# Patient Record
Sex: Male | Born: 1950 | Race: White | Hispanic: No
Health system: Southern US, Community
[De-identification: ages and names within clinical notes are randomized; demographics above are authoritative.]

---

## 2012-02-17 ENCOUNTER — Inpatient Hospital Stay: Payer: Self-pay | Admitting: Internal Medicine

## 2012-02-17 LAB — BASIC METABOLIC PANEL
Chloride: 103 mmol/L (ref 98–107)
Co2: 29 mmol/L (ref 21–32)
Glucose: 93 mg/dL (ref 65–99)
Osmolality: 277 (ref 275–301)
Potassium: 4.4 mmol/L (ref 3.5–5.1)

## 2012-02-17 LAB — CBC
HGB: 15.1 g/dL (ref 13.0–18.0)
MCV: 90 fL (ref 80–100)
RBC: 4.94 10*6/uL (ref 4.40–5.90)
RDW: 14.5 % (ref 11.5–14.5)
WBC: 10.1 10*3/uL (ref 3.8–10.6)

## 2012-02-17 LAB — TROPONIN I: Troponin-I: 0.11 ng/mL — ABNORMAL HIGH

## 2012-02-18 LAB — TROPONIN I: Troponin-I: 0.1 ng/mL — ABNORMAL HIGH

## 2012-02-18 LAB — LIPID PANEL: Triglycerides: 104 mg/dL (ref 0–200)

## 2012-02-19 LAB — BASIC METABOLIC PANEL
Anion Gap: 8 (ref 7–16)
Chloride: 106 mmol/L (ref 98–107)
EGFR (Non-African Amer.): >60
Glucose: 95 mg/dL (ref 65–99)
Osmolality: 276 (ref 275–301)

## 2012-02-19 LAB — CBC
HGB: 15.6 g/dL (ref 13.0–18.0)
MCH: 29.6 pg (ref 26.0–34.0)
MCHC: 32.7 g/dL (ref 32.0–36.0)
RBC: 5.26 10*6/uL (ref 4.40–5.90)

## 2015-03-04 NOTE — Consult Note (Signed)
General Aspect This is a 64 year old male that presented to the hospital after several days of chest discomfort and elevation of blood pressure.  Patient states he was awakened by chest discomfort Friday.  He thought it was just indigestion.  However, it continued into Saturday.  He continued his usual activities thinking it was just discomfort.  However, then he went to the drug store and noted that his blood pressure was elevated and presented to the ER. Patient does have a smoking history.  Denies any previous coronary disease.  He states he still has some soreness in his chest today.  He did have mildly elevated troponins but no significant EKG changes.   Physical Exam:   GEN well developed, no acute distress    HEENT pink conjunctivae    NECK supple    RESP normal resp effort  clear BS    CARD Regular rate and rhythm  Normal, S1, S2    ABD denies tenderness    EXTR negative edema    SKIN skin turgor decreased    NEURO cranial nerves intact, motor/sensory function intact    PSYCH alert, A+O to time, place, person, good insight   Review of Systems:   Subjective/Chief Complaint Chest pain    Gastrointestinal: frequent indigestion    ROS Pt not able to provide ROS    Medications/Allergies Reviewed Medications/Allergies reviewed   Routine Hem:  09-Apr-13 20:15    WBC (CBC) 10.1   RBC (CBC) 4.94   Hemoglobin (CBC) 15.1   Hematocrit (CBC) 44.6   Platelet Count (CBC) 248   MCV 90   MCH 30.6   MCHC 33.9   RDW 14.5  Routine Chem:  09-Apr-13 20:15    Glucose, Serum 93   BUN 11   Creatinine (comp) 1.19   Sodium, Serum 139   Potassium, Serum 4.4   Chloride, Serum 103   CO2, Serum 29   Calcium (Total), Serum 8.6   Anion Gap 7   Osmolality (calc) 277   eGFR (African American) >60   eGFR (Non-African American) >60  Cardiac:  09-Apr-13 20:15    Troponin I 0.11    Iodine: Anaphylaxis  Penicillin: Hives  Erythromycin: Hives  Vital Signs/Nurse's Notes: **Vital  Signs.:   10-Apr-13 09:37   Vital Signs Type Routine   Temperature Temperature (F) 98.7   Celsius 37   Pulse Pulse 95   Pulse source per Dinamap   Respirations Respirations 18   Systolic BP Systolic BP 673   BP Source non-invasive   Pulse Ox % Pulse Ox % 97   Pulse Ox Activity Level  At rest   Oxygen Delivery Room Air/ 21 %     Impression 64 year old male with several hours of chest discomfort and presenting to the ER several days after-the-fact with elevated troponins, normal EKG.    Plan 1.  Continue to observe on telemetry. 2.  Stress Myoview pending. 3.  Further recommendations per Dr. Nehemiah Massed will be made once the results of the stress Myoview are known.   Electronic Signatures: Roderic Palau (NP)  (Signed 10-Apr-13 14:18)  Authored: General Aspect/Present Illness, History and Physical Exam, Review of System, Labs, Allergies, Vital Signs/Nurse's Notes, Impression/Plan   Last Updated: 10-Apr-13 14:18 by Roderic Palau (NP)

## 2015-03-04 NOTE — H&P (Signed)
PATIENT NAME:  Marcus Floyd, Marcus Floyd MR#:  [id number] DATE OF BIRTH:  1950/11/30  DATE OF ADMISSION:  02/17/2012  PRIMARY CARE PHYSICIAN: Dr. Jeannette Corpus [place] in [place]  CHIEF COMPLAINT: Chest pain.   HISTORY OF PRESENT ILLNESS: Marcus Floyd is a 64 year old Caucasian male with unremarkable past medical history. He was in his usual state of health until Friday, that is five days ago, when he woke up in the morning feeling bad heartburn feeling in his chest. The pain described as being severe, reached sharp in quality, the severity was 5 on a scale of 10. He had several episodes of vomiting x5 associated with extensive sweating. The pain described as sharp, but sometimes gas-like feeling and this lasted about two hours then after that the pain might come back to a lesser degree then had sustained pain about 3 on a scale of 10. It had some pleuritic component that sometimes upon deep breathing he might feel some little sharp pain that is just temporary. Patient did not seek medical advice until today when he went to [facility] and he was interested to check his blood pressure. He states that his blood pressure runs around 110 to 115 systolic, never higher. Today his blood pressure was 164/74, then he decided that this is unusual and he came to the Emergency Department for evaluation. He had an EKG done here which showed poor progression of R waves in the anterior chest leads with possible previous anterior myocardial infarction and his troponin was elevated at 0.11. Patient was admitted for further evaluation and management.    REVIEW OF SYSTEMS: CONSTITUTIONAL: He denies fever, but he feels feverish. No chills. No night sweats. EYES: No blurring of vision. No double vision. ENT: No hearing impairment. No sore throat. No dysphagia. CARDIOVASCULAR: Admits having chest pain and some shortness of breath. No edema. No syncope. RESPIRATORY: As above he has chest pain. No cough. No sputum production.  GASTROINTESTINAL: No abdominal pain but he had a few days ago several episodes of vomiting without hematemesis. No melena. No diarrhea. GENITOURINARY: No dysuria. No frequency of urination. MUSCULOSKELETAL: No joint pain or swelling. No muscular pain or swelling. INTEGUMENTARY: No skin rash. No ulcers. NEUROLOGIC: No focal weakness. No seizure activity. No headache. PSYCHIATRY: No anxiety. No depression. ENDOCRINE: No polyuria or polydipsia. No heat or cold intolerance.   PAST MEDICAL HISTORY: History of schizoaffective disorder since 2007. He was placed on disability based on that.   PAST SURGICAL HISTORY:  1. History of left shoulder surgery.  2. History of toe spur surgery.  3. History of tonsillectomy and adenoidectomy.   FAMILY HISTORY: His mother died at age of 8 from complications of Alzheimer's dementia. His father died at the age of 13 from esophageal cancer.   SOCIAL HISTORY: The patient is single. He lives on disability based on his schizoaffective disorder.   SOCIAL HABITS: Chronic smoker, 1 pack per day since age of 55. No history of alcohol or other drug abuse.   ADMISSION MEDICATIONS:  1. Seroquel 200 mg at night. 2. Paxil 20 mg once in the morning. 3. Zantac 150 mg twice a day.   ALLERGIES: Penicillin and erythromycin causing skin rash.   PHYSICAL EXAMINATION:  VITAL SIGNS: Blood pressure 141/81, respiratory rate 18, pulse 93, temperature 99.3, oxygen saturation 98%.   GENERAL APPEARANCE: Elderly male laying in bed in no acute distress. He looks comfortable.   HEAD/NECK: No pallor. No icterus. No cyanosis.   ENT: Hearing was normal. Nasal mucosa,  lips, tongue were normal.   EYES: Normal eyelids and conjunctivae. Pupils about 5 mm, equal and reactive to light.   NECK: Supple. Trachea at midline. No cervical lymphadenopathy. No masses.   HEART: Distant heart sounds. Faint S1 and S2. No gallop. No murmur. No carotid bruits.   RESPIRATORY: Normal breathing pattern  without use of accessory muscles. No rales. No wheezing.   ABDOMEN: Soft without tenderness. No hepatosplenomegaly. No masses. No hernia.   SKIN: No ulcers. No subcutaneous nodules.   MUSCULOSKELETAL: No joint swelling. The patient does have clubbing of fingers.   NEUROLOGIC: Cranial nerves II through XII are intact. No focal motor deficits.   PSYCHIATRIC: Patient is alert, oriented x3. Mood and affect were normal.   LABORATORY, DIAGNOSTIC, AND RADIOLOGICAL DATA: Chest x-ray showed heart size was normal. No effusion. No consolidation. EKG showed normal sinus rhythm at rate of 98 per minute. Left axis deviation. Poor progression of R waves in the anterior chest leads raising possibility of previous anterior MI. Otherwise unremarkable EKG except for tendency towards low voltage. Serum glucose 93, BUN 11, creatinine 1.1, sodium 139, potassium 4.4, troponin elevated at 0.11. CBC showed white count 10,000, hemoglobin 15, hematocrit 44, platelet count 248.   ASSESSMENT:  1. Chest pain with elevated troponin with clinical presentation and history consistent with recent myocardial infarction, likely non-ST elevation MI, however, I am not sure since he presented after five days from the event. Right now his EKG does not show any ST elevation.  2. Hypertension, this is new for him. 3. Tobacco abuse.  4. History of schizoaffective disorder.  5. History of left shoulder surgery.  6. History of adenoidectomy and tonsillectomy.   PLAN: Will admit patient to the telemetry unit. Follow up on his cardiac enzymes. Start aspirin and Plavix. Add statin using Lipitor 40 mg a day. I will place the patient on deep vein thrombosis prophylaxis with Lovenox 40 mg subcutaneous a day. Obtain echocardiogram. Consult cardiology. I advised the patient to quit smoking indefinitely. I will place nicotine patch while he is here.    TIME SPENT EVALUATING THIS PATIENT: More than 45 minutes.    ____________________________ Carney CornersAmir M. Rudene Rearwish, MD amd:cms D: 02/17/2012 22:47:58 ET T: 02/18/2012 06:44:32 ET JOB#: [id number]  cc: Carney CornersAmir M. Rudene Rearwish, MD, <Dictator> Dr. Jeannette CorpusQuentin Mewborn [place]  Karolee OhsAMIR Dala DockM Darolyn Double MD ELECTRONICALLY SIGNED 02/18/2012 7:09

## 2015-03-04 NOTE — Discharge Summary (Signed)
PATIENT NAME:  Marcus Floyd, Marcus Floyd MR#:  [id number] DATE OF BIRTH:  [birth year]  DATE OF ADMISSION:  02/17/2012 DATE OF DISCHARGE:  02/20/2012  PRESENTING COMPLAINT: Chest pain.   DISCHARGE DIAGNOSES:  1. 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  2. Hyperlipidemia.  3. Schizoaffective disorder.   CONDITION ON DISCHARGE: Fair. Vitals stable.   MEDICATIONS:  1. Toprol-XL 25 mg p.o. daily.  2. Aspirin 81 mg daily.  3. Lipitor 20 mg at bedtime.  4. Nitroglycerin 0.4 mg sublingual p.r.n. for chest pain.  5. Zantac 150 mg b.i.d.  6. Seroquel 200 mg b.i.d.  7. Paxil 20 mg p.o. daily.   FOLLOW-UP: Follow-up with Dr. Florian BuffSchroeder of [facility]oracic [facility] on April 15th, Monday, at 8 a.m. in the [facility], phone number given. The patient is requested to come back to the Emergency Room if signs and symptoms worsen or get in touch with Dr. Gwen PoundsKowalski.   LABORATORY, DIAGNOSTIC, AND RADIOLOGICAL DATA: Cardiac catheterization by Dr. Gwen PoundsKowalski done on April 11th shows proximal LAD 100% stenosis, D1 lesion 85%, proximal circumflex 100% stenosis, OM-1 lesion 85% stenosis, proximal RCA 75% stenosis, lesion 2 85% stenosis, mid RCA 85% stenosis. CBC and basic metabolic panel within normal limits.   BRIEF SUMMARY OF HOSPITAL COURSE: Marcus Floyd is a [age] year old Caucasian gentleman with history of schizoaffective disorder who came to the Emergency Room with ongoing chest discomfort, left-sided, on and off for the last couple of days. The patient also noted his blood pressure was elevated. He was admitted with:  1. Acute non-Q-wave MI. The patient's cardiac enzymes were up without EKG changes. Myoview showed abnormal wall motion abnormality. The patient underwent cardiac cath by Dr. Gwen PoundsKowalski which showed triple vessel disease, will need coronary artery bypass graft which has been arranged by Dr. Gwen PoundsKowalski to [facility]  next Tuesday, 02/24/2012, by Dr. Florian BuffSchroeder. The patient is chest pain free.  2. Hyperlipidemia, new diagnosis. On Lipitor. Lifestyle and dietary changes discussed.  3. Schizoaffective disorder. The patient will continue on Paxil and Seroquel.  4. Gastroesophageal reflux disease. The prior to discharge was continued on Zantac.  Hospital stay otherwise remained stable.   CODE STATUS: The patient remained a FULL CODE.   TIME SPENT: 40 minutes.   ____________________________ Wylie HailSona A. Allena KatzPatel, MD sap:drc D: 02/20/2012 16:39:37 ET T: 02/23/2012 09:39:02 ET JOB#: [id number]  cc: Myrna Vonseggern A. Allena KatzPatel, MD, <Dictator> Dr. Florian BuffSchroeder in the [facility]D Clinic at [facility]  Bruce J. Kowalski, MD Willow OraSONA A Luby Seamans MD ELECTRONICALLY SIGNED 03/01/2012 13:20
